# Patient Record
Sex: Female | Born: 1935 | Race: Black or African American | Hispanic: No | Marital: Single
Health system: Southern US, Community
[De-identification: ages and names within clinical notes are randomized; demographics above are authoritative.]

---

## 2007-03-25 ENCOUNTER — Ambulatory Visit: Payer: Self-pay | Admitting: Family Medicine

## 2008-07-25 ENCOUNTER — Ambulatory Visit: Payer: Self-pay | Admitting: Family Medicine

## 2011-11-15 ENCOUNTER — Emergency Department: Payer: Self-pay | Admitting: Internal Medicine

## 2011-11-15 LAB — CBC
HCT: 37.6 % (ref 35.0–47.0)
HGB: 12.6 g/dL (ref 12.0–16.0)
MCH: 32.5 pg (ref 26.0–34.0)
MCHC: 33.6 g/dL (ref 32.0–36.0)
MCV: 97 fL (ref 80–100)
RDW: 13.3 % (ref 11.5–14.5)

## 2011-11-15 LAB — PROTIME-INR
INR: 0.9
Prothrombin Time: 12.8 secs (ref 11.5–14.7)

## 2011-11-15 LAB — COMPREHENSIVE METABOLIC PANEL
Albumin: 4 g/dL (ref 3.4–5.0)
Anion Gap: 11 (ref 7–16)
BUN: 25 mg/dL — ABNORMAL HIGH (ref 7–18)
Calcium, Total: 9.9 mg/dL (ref 8.5–10.1)
Creatinine: 1.06 mg/dL (ref 0.60–1.30)
Glucose: 153 mg/dL — ABNORMAL HIGH (ref 65–99)
Potassium: 4.2 mmol/L (ref 3.5–5.1)
SGOT(AST): 18 U/L (ref 15–37)
Sodium: 145 mmol/L (ref 136–145)
Total Protein: 7.9 g/dL (ref 6.4–8.2)

## 2014-04-27 IMAGING — CR DG CHEST 1V PORT
1 series · 1 of 1 positions shown · non-contrast
Comparison: none

REASON FOR EXAM: choking, persistant cough
COMMENTS:

PROCEDURE:     DXR - DXR PORTABLE CHEST SINGLE VIEW  - November 15, 2011  [DATE]
RESULT:     Comparison: 08/06/2008

[ap]
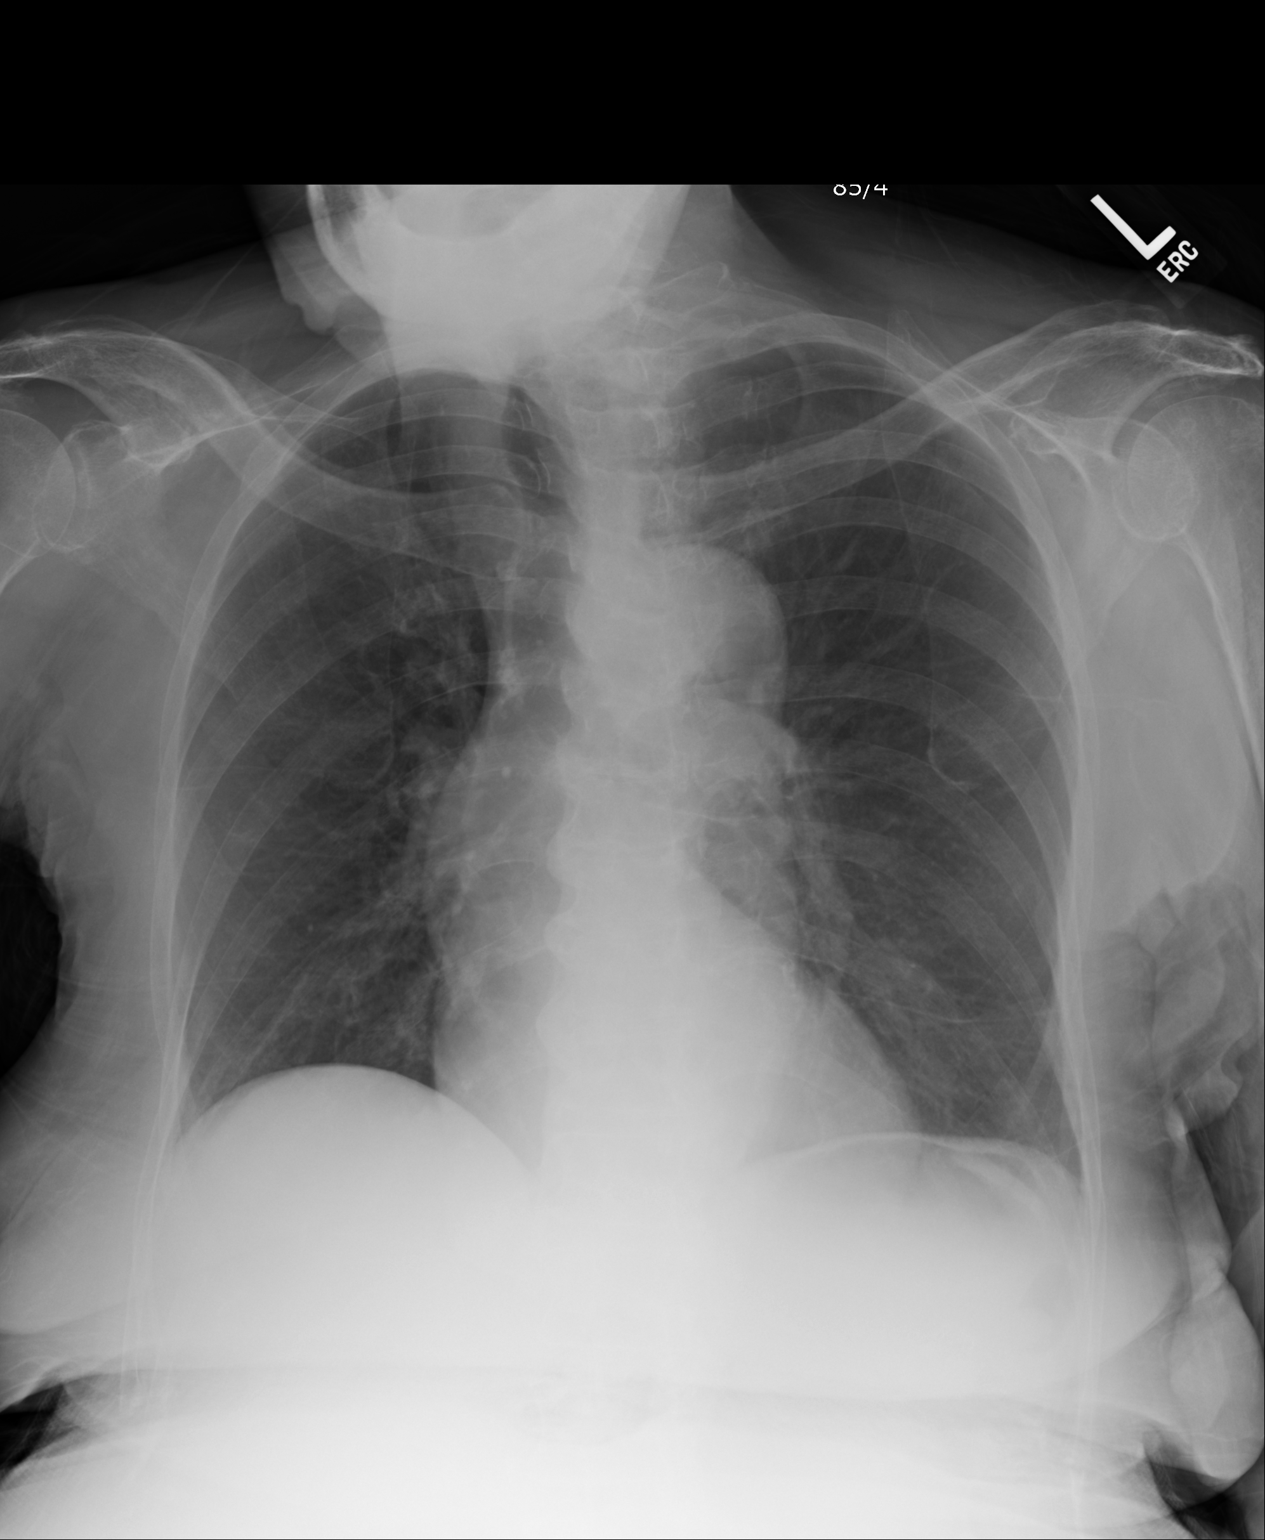

[1 of 1 positions shown; findings below may reference images not displayed]

FINDINGS: Single portable AP chest radiograph is provided.  There is no focal
parenchymal opacity, pleural effusion, or pneumothorax. Normal
cardiomediastinal silhouette. The osseous structures are unremarkable.
IMPRESSION: No acute disease of the che[REDACTED]

## 2014-06-06 NOTE — Consult Note (Signed)
PATIENT NAME:  Savannah Grant, Savannah Grant MR#:  [id number] DATE OF BIRTH:  05-20-1929  DATE OF CONSULTATION:  11/15/2011  REFERRING PHYSICIAN:  Dr. Mindi Junker  CONSULTING PHYSICIAN:  Christena Deem, MD   PRIMARY CARE PHYSICIAN: Tasia Catchings, MD, [facility]   REASON FOR CONSULTATION: Possible esophageal meat impaction.   HISTORY OF PRESENT ILLNESS: Ms. Ruffalo is an 81 year old African American female who presents with her family members this evening with a possible esophageal foreign body. She apparently was eating dinner earlier this evening, began to have some choking, and since that time has not been able to tolerate even her own secretions. The meal was consisting of chicken and string beans. She is not handling her saliva. The patient is edentulous. She does have some dentures but she does not use them. The patient is demented and will not answer questions for the examiner. She is only marginally communicative with her family. She appears to be in no overt pain.   Past GI history is that she had a colonoscopy at [facility] an unknown number of years ago. Results are unknown. She apparently has not had difficulty with swallowing in the past. She apparently has never had an upper scope before.   PAST MEDICAL HISTORY:  1. Advancing dementia. 2. Type II diabetes.  3. Hypertension.  4. She does have history of some problems with some constipation.   REVIEW OF SYSTEMS: Not possible to obtain.   ALLERGIES: She is allergic to an unknown antibiotic.   OUTPATIENT MEDICATIONS:  1. Omega-3 Fish Oil 1 tablet a day. 2. Metformin 500 mg twice a day. 3. Accupril 5 mg once a day.  4. Children's multiple vitamin 1 tablet daily. 5. Plavix 75 mg a day. 6. Aricept 5 mg daily. 7. Namenda 10 mg daily.  8. P.r.n. earwax removal.  9. Prilosec 20 mg when taking Advil. 10. Stool softener 100 mg.   PHYSICAL EXAMINATION:   GENERAL: The patient is a thin elderly female in no acute distress. Will at times cough  and choke and produce some watery material. Family have been suctioning the oropharynx with a Yankauer.   VITAL SIGNS: Pulse 84, respirations 24, blood pressure 144/67, pulse oximetry 91.   HEENT: Normocephalic, atraumatic.   EYES: Anicteric. The patient is edentulous.   NECK: No JVD. No thyromegaly.   HEART: Regular rate and rhythm.   LUNGS: Clear.   ABDOMEN: Soft, nontender, nondistended. Bowel sounds are positive.   LABORATORY, DIAGNOSTIC, AND RADIOLOGICAL DATA: She has had an EKG that is read by the Emergency Room doctor as being LVH, rate of 90, normal sinus, normal axis, normal QRS, normal ST-T. Her pulse oximetry is 93%.   She has had a portable chest film showing no acute diseases of the chest, normal cardiomediastinal silhouette.   Glucose 153, BUN 25, creatinine 1.06, sodium 145, potassium 4.2, chloride 106, bicarb 28, osmolality 296, calcium 9.9. Hepatic profile is normal with an alkaline phosphatase of 60, total bilirubin 0.2, albumin 4.0. Hemogram shows a white count of 7.6, hemoglobin and hematocrit of 12.6/37.6, platelet count 235, MCV 97. Pro-time 12.8. INR 0.9.   ASSESSMENT: Probable esophageal foreign body/meat impaction. Difficult to obtain any history from the patient herself.   RECOMMENDATION: EGD with anesthesia assistance for evaluation of the esophagus and possible foreign body removal. I have discussed the risks, benefits, and complications of these procedures to include, but not limited to, bleeding, infection, perforation, and the risk of sedation and family members wish to proceed. I have also  cautioned them about the increased risk for bleeding secondary to her being on the anticoagulant Plavix. It may also be necessary to admit her overnight for observation depending on how she does with the procedure.   ____________________________ Christena DeemMartin U. Skulskie, MD mus:drc D: 11/15/2011 23:54:54 ET T: 11/16/2011 08:41:37 ET JOB#: [id number]  cc: Christena DeemMartin U. Skulskie,  MD, <Dictator> Christena DeemMARTIN U SKULSKIE MD ELECTRONICALLY SIGNED 12/18/2011 11:19

## 2014-06-06 NOTE — Consult Note (Signed)
Brief Consult Note: Diagnosis: esophageal foriegn body/food impaction.   Patient was seen by consultant.   Consult note dictated.   Recommend to proceed with surgery or procedure.   Orders entered.   Discussed with Attending MD.   Comments: Patient seen and examined, full consult dictated.  Patient presenting with probable esophageal food impaction.  Unable to obtain detailed history from patient due to advanced dementia.  Patietn on plavix.  I have discussed the risks benefits and complications of egd and anesthesia to family members/caretakers to include not limited to bleeding infe tion perforation and sedation and they wish to proceed.  Electronic Signatures: Barnetta ChapelSkulskie, Martin (MD)  (Signed 28-Sep-13 23:57)  Authored: Brief Consult Note   Last Updated: 28-Sep-13 23:57 by Barnetta ChapelSkulskie, Martin (MD)

## 2016-06-17 DEATH — deceased

## 2016-09-17 DEATH — deceased
